# Patient Record
Sex: Male | Born: 1995 | Race: White | Hispanic: No | Smoking: Never smoker
Health system: Southern US, Community
[De-identification: ages and names within clinical notes are randomized; demographics above are authoritative.]

## PROBLEM LIST (undated history)

## (undated) HISTORY — PX: OTHER SURGICAL HISTORY: SHX169

---
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
## 2017-01-04 DIAGNOSIS — R51 Headache: Secondary | ICD-10-CM | POA: Diagnosis not present

## 2017-01-04 DIAGNOSIS — S0093XA Contusion of unspecified part of head, initial encounter: Secondary | ICD-10-CM | POA: Diagnosis not present

## 2017-02-15 IMAGING — CR DG WRIST COMPLETE 3+V*L*
1 series · 4 of 4 positions shown · non-contrast
Comparison: LEFT forearm radiographs 02/19/2008

CLINICAL DATA: Slipped on plastic and fell onto concrete today,
pain throughout entire wrist, initial encounter

EXAM:
LEFT WRIST - COMPLETE 3+ VIEW

[Series 1: dg wrist complete left · 0.14mm/px · 4 of 4 slices shown]
[im 1/4]
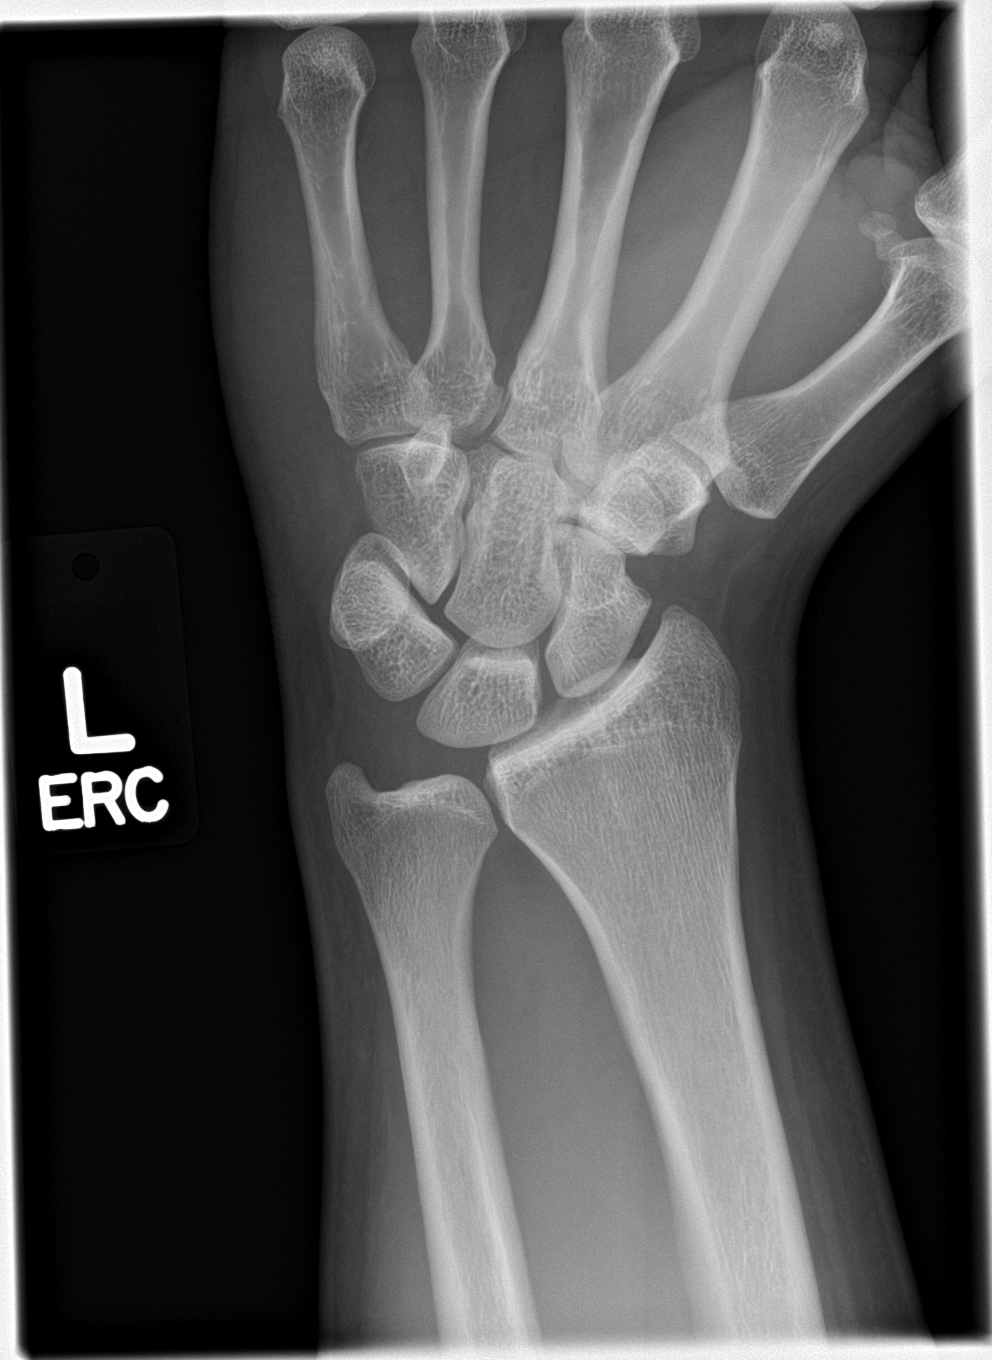
[im 2/4]
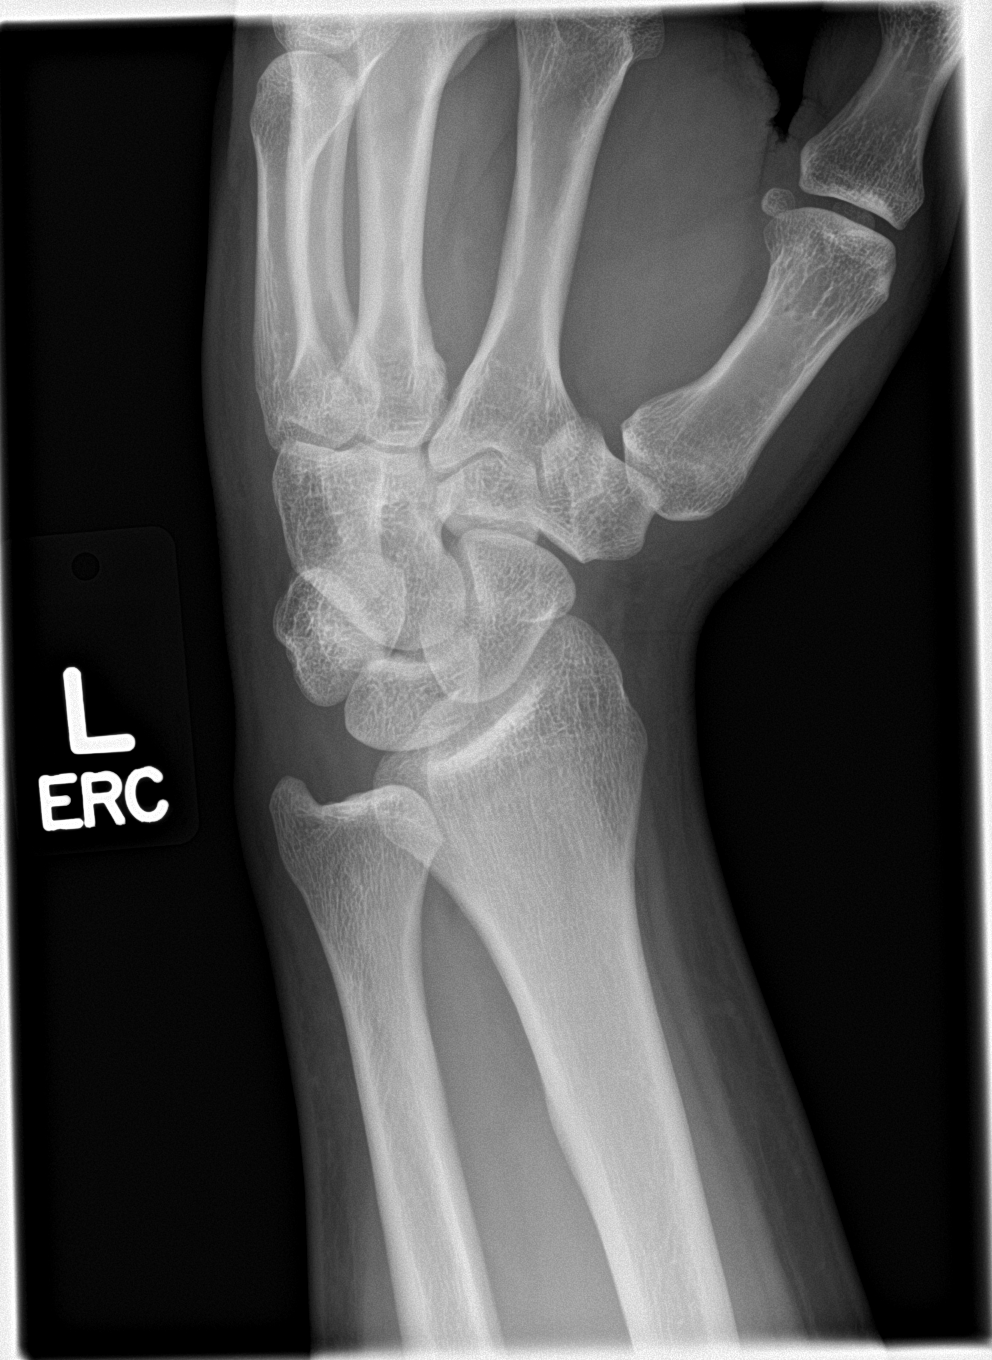
[im 3/4]
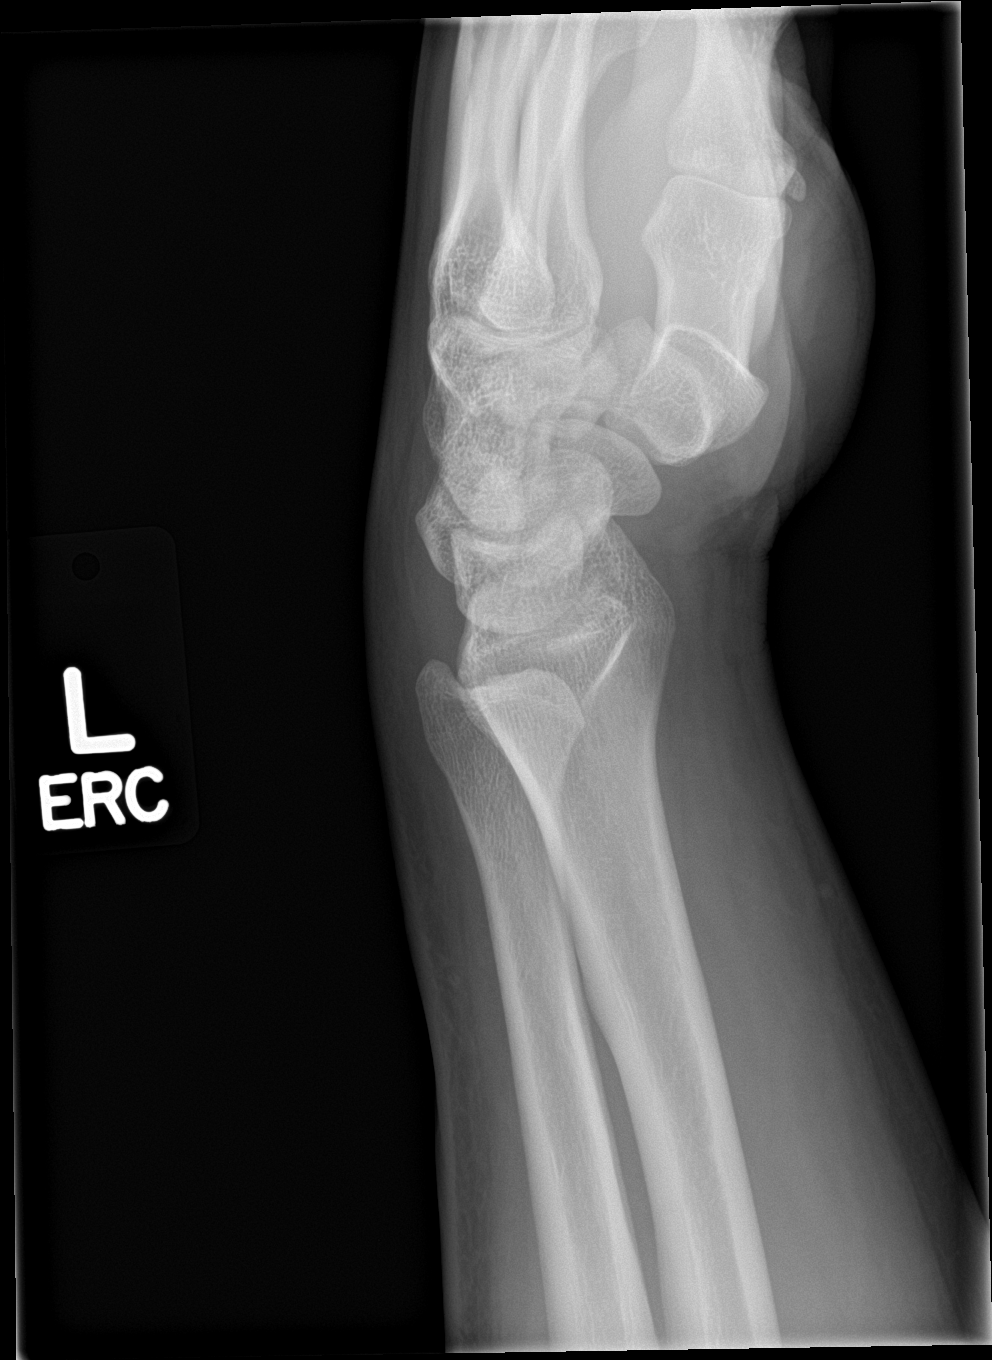
[im 4/4]
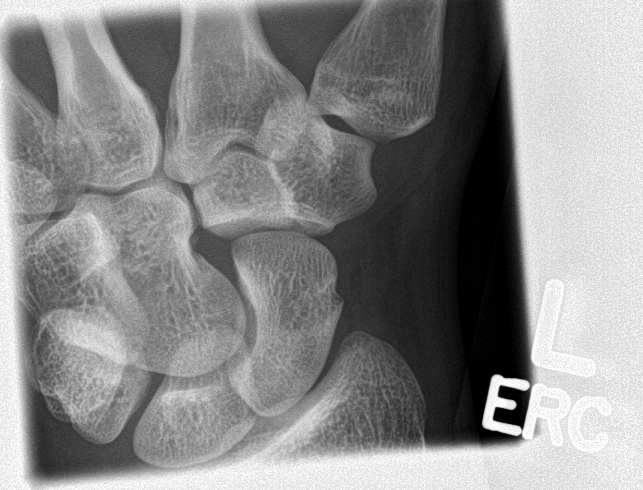

[4 of 4 positions shown; findings below may reference images not displayed]

FINDINGS: Osseous mineralization normal.

Joint spaces preserved.

No acute fracture, dislocation or bone destruction.
IMPRESSION: No acute osseous abnormalities.

## 2017-06-03 DIAGNOSIS — J680 Bronchitis and pneumonitis due to chemicals, gases, fumes and vapors: Secondary | ICD-10-CM | POA: Diagnosis not present

## 2017-06-03 DIAGNOSIS — R918 Other nonspecific abnormal finding of lung field: Secondary | ICD-10-CM | POA: Diagnosis not present

## 2017-06-03 DIAGNOSIS — T578X1A Toxic effect of other specified inorganic substances, accidental (unintentional), initial encounter: Secondary | ICD-10-CM | POA: Diagnosis not present

## 2017-06-03 DIAGNOSIS — R0602 Shortness of breath: Secondary | ICD-10-CM | POA: Diagnosis not present

## 2017-11-21 DIAGNOSIS — J209 Acute bronchitis, unspecified: Secondary | ICD-10-CM | POA: Diagnosis not present

## 2017-11-25 DIAGNOSIS — S76012A Strain of muscle, fascia and tendon of left hip, initial encounter: Secondary | ICD-10-CM | POA: Diagnosis not present

## 2017-11-25 DIAGNOSIS — R030 Elevated blood-pressure reading, without diagnosis of hypertension: Secondary | ICD-10-CM | POA: Diagnosis not present

## 2017-12-16 DIAGNOSIS — M799 Soft tissue disorder, unspecified: Secondary | ICD-10-CM | POA: Diagnosis not present

## 2017-12-16 DIAGNOSIS — S300XXS Contusion of lower back and pelvis, sequela: Secondary | ICD-10-CM | POA: Diagnosis not present

## 2018-01-09 DIAGNOSIS — J40 Bronchitis, not specified as acute or chronic: Secondary | ICD-10-CM | POA: Diagnosis not present

## 2018-01-09 DIAGNOSIS — R062 Wheezing: Secondary | ICD-10-CM | POA: Diagnosis not present

## 2018-06-03 DIAGNOSIS — J069 Acute upper respiratory infection, unspecified: Secondary | ICD-10-CM | POA: Diagnosis not present

## 2018-06-03 DIAGNOSIS — J209 Acute bronchitis, unspecified: Secondary | ICD-10-CM | POA: Diagnosis not present

## 2018-08-15 DIAGNOSIS — R0789 Other chest pain: Secondary | ICD-10-CM | POA: Diagnosis not present

## 2018-08-15 DIAGNOSIS — R030 Elevated blood-pressure reading, without diagnosis of hypertension: Secondary | ICD-10-CM | POA: Diagnosis not present

## 2018-08-21 DIAGNOSIS — E860 Dehydration: Secondary | ICD-10-CM | POA: Diagnosis not present

## 2018-08-21 DIAGNOSIS — R05 Cough: Secondary | ICD-10-CM | POA: Diagnosis not present

## 2018-08-21 DIAGNOSIS — R0789 Other chest pain: Secondary | ICD-10-CM | POA: Diagnosis not present

## 2018-08-30 DIAGNOSIS — R030 Elevated blood-pressure reading, without diagnosis of hypertension: Secondary | ICD-10-CM | POA: Diagnosis not present

## 2018-08-30 DIAGNOSIS — Z6841 Body Mass Index (BMI) 40.0 and over, adult: Secondary | ICD-10-CM | POA: Diagnosis not present

## 2018-12-04 DIAGNOSIS — J029 Acute pharyngitis, unspecified: Secondary | ICD-10-CM | POA: Diagnosis not present

## 2018-12-04 DIAGNOSIS — R112 Nausea with vomiting, unspecified: Secondary | ICD-10-CM | POA: Diagnosis not present

## 2018-12-04 DIAGNOSIS — R6889 Other general symptoms and signs: Secondary | ICD-10-CM | POA: Diagnosis not present
